# Patient Record
Sex: Male | Born: 1995 | Race: Black or African American | Hispanic: No | Marital: Single | Smoking: Never smoker
Health system: Southern US, Community
[De-identification: ages and names within clinical notes are randomized; demographics above are authoritative.]

---
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
## 2016-12-17 ENCOUNTER — Encounter (HOSPITAL_COMMUNITY): Payer: Self-pay | Admitting: Emergency Medicine

## 2016-12-17 ENCOUNTER — Emergency Department (HOSPITAL_COMMUNITY): Payer: Self-pay

## 2016-12-17 ENCOUNTER — Emergency Department (HOSPITAL_COMMUNITY)
Admission: EM | Admit: 2016-12-17 | Discharge: 2016-12-18 | Disposition: A | Discharge status: Home / Self Care | Payer: Self-pay | Attending: Emergency Medicine | Admitting: Emergency Medicine

## 2016-12-17 DIAGNOSIS — Y999 Unspecified external cause status: Secondary | ICD-10-CM | POA: Insufficient documentation

## 2016-12-17 DIAGNOSIS — Y929 Unspecified place or not applicable: Secondary | ICD-10-CM | POA: Insufficient documentation

## 2016-12-17 DIAGNOSIS — W500XXA Accidental hit or strike by another person, initial encounter: Secondary | ICD-10-CM | POA: Insufficient documentation

## 2016-12-17 DIAGNOSIS — S60222A Contusion of left hand, initial encounter: Secondary | ICD-10-CM | POA: Insufficient documentation

## 2016-12-17 DIAGNOSIS — Y9367 Activity, basketball: Secondary | ICD-10-CM | POA: Insufficient documentation

## 2016-12-17 MED ORDER — IBUPROFEN 800 MG PO TABS: 800.0000 mg | ORAL_TABLET | Freq: Three times a day (TID) | ORAL | 0 refills | Status: AC | PRN

## 2016-12-17 NOTE — Discharge Instructions (Signed)
Your x-rays did not show any broken bones.  Ice and elevate the hand.  Return here as needed
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
